# Patient Record
Sex: Female | Born: 1960 | Race: Black or African American | Hispanic: No | Marital: Married | Smoking: Never smoker
Health system: Southern US, Community
[De-identification: ages and names within clinical notes are randomized; demographics above are authoritative.]

## PROBLEM LIST (undated history)

## (undated) DIAGNOSIS — R011 Cardiac murmur, unspecified: Secondary | ICD-10-CM

## (undated) DIAGNOSIS — Z9889 Other specified postprocedural states: Secondary | ICD-10-CM

## (undated) DIAGNOSIS — R51 Headache: Secondary | ICD-10-CM

## (undated) DIAGNOSIS — T7840XA Allergy, unspecified, initial encounter: Secondary | ICD-10-CM

## (undated) DIAGNOSIS — E559 Vitamin D deficiency, unspecified: Secondary | ICD-10-CM

## (undated) DIAGNOSIS — D649 Anemia, unspecified: Secondary | ICD-10-CM

## (undated) DIAGNOSIS — R112 Nausea with vomiting, unspecified: Secondary | ICD-10-CM

## (undated) HISTORY — PX: OTHER SURGICAL HISTORY: SHX169

## (undated) HISTORY — DX: Vitamin D deficiency, unspecified: E55.9

## (undated) HISTORY — DX: Allergy, unspecified, initial encounter: T78.40XA

## (undated) HISTORY — PX: TUBAL LIGATION: SHX77

---

## 1998-04-01 ENCOUNTER — Emergency Department (HOSPITAL_COMMUNITY): Admission: EM | Admit: 1998-04-01 | Discharge: 1998-04-01 | Payer: Self-pay | Admitting: Emergency Medicine

## 1998-07-19 ENCOUNTER — Other Ambulatory Visit: Admission: RE | Admit: 1998-07-19 | Discharge: 1998-07-19 | Payer: Self-pay | Admitting: Obstetrics

## 1999-11-08 ENCOUNTER — Encounter: Admission: RE | Admit: 1999-11-08 | Discharge: 1999-11-08 | Payer: Self-pay | Admitting: Obstetrics

## 1999-11-08 ENCOUNTER — Encounter: Payer: Self-pay | Admitting: Obstetrics

## 2005-09-08 ENCOUNTER — Encounter: Admission: RE | Admit: 2005-09-08 | Discharge: 2005-09-08 | Payer: Self-pay | Admitting: Internal Medicine

## 2006-10-20 ENCOUNTER — Encounter: Admission: RE | Admit: 2006-10-20 | Discharge: 2006-10-20 | Payer: Self-pay | Admitting: Obstetrics

## 2006-10-28 ENCOUNTER — Encounter: Admission: RE | Admit: 2006-10-28 | Discharge: 2006-10-28 | Payer: Self-pay | Admitting: Obstetrics

## 2007-11-30 ENCOUNTER — Encounter: Admission: RE | Admit: 2007-11-30 | Discharge: 2007-11-30 | Payer: Self-pay | Admitting: Obstetrics

## 2009-05-15 ENCOUNTER — Encounter: Admission: RE | Admit: 2009-05-15 | Discharge: 2009-05-15 | Payer: Self-pay | Admitting: Obstetrics

## 2009-05-31 ENCOUNTER — Ambulatory Visit (HOSPITAL_COMMUNITY): Admission: RE | Admit: 2009-05-31 | Discharge: 2009-05-31 | Payer: Self-pay | Admitting: Obstetrics

## 2010-06-10 LAB — CBC
HCT: 38 % (ref 36.0–46.0)
Hemoglobin: 12.7 g/dL (ref 12.0–15.0)
MCHC: 33.3 g/dL (ref 30.0–36.0)
MCV: 93.3 fL (ref 78.0–100.0)
Platelets: 276 K/uL (ref 150–400)
RBC: 4.08 MIL/uL (ref 3.87–5.11)
RDW: 13.4 % (ref 11.5–15.5)
WBC: 5.3 K/uL (ref 4.0–10.5)

## 2010-06-10 LAB — PREGNANCY, URINE: Preg Test, Ur: NEGATIVE

## 2010-12-31 ENCOUNTER — Other Ambulatory Visit (HOSPITAL_COMMUNITY): Payer: Self-pay | Admitting: Obstetrics

## 2010-12-31 DIAGNOSIS — N920 Excessive and frequent menstruation with regular cycle: Secondary | ICD-10-CM

## 2010-12-31 DIAGNOSIS — Z1231 Encounter for screening mammogram for malignant neoplasm of breast: Secondary | ICD-10-CM

## 2011-01-17 ENCOUNTER — Ambulatory Visit (HOSPITAL_COMMUNITY)
Admission: RE | Admit: 2011-01-17 | Discharge: 2011-01-17 | Disposition: A | Discharge status: Home / Self Care | Payer: 59 | Source: Ambulatory Visit | Attending: Obstetrics | Admitting: Obstetrics

## 2011-01-17 DIAGNOSIS — D259 Leiomyoma of uterus, unspecified: Secondary | ICD-10-CM | POA: Insufficient documentation

## 2011-01-17 DIAGNOSIS — N920 Excessive and frequent menstruation with regular cycle: Secondary | ICD-10-CM

## 2011-01-17 DIAGNOSIS — N949 Unspecified condition associated with female genital organs and menstrual cycle: Secondary | ICD-10-CM | POA: Insufficient documentation

## 2011-01-17 DIAGNOSIS — Z1231 Encounter for screening mammogram for malignant neoplasm of breast: Secondary | ICD-10-CM

## 2011-01-21 ENCOUNTER — Other Ambulatory Visit: Payer: Self-pay | Admitting: Obstetrics

## 2011-01-21 DIAGNOSIS — R928 Other abnormal and inconclusive findings on diagnostic imaging of breast: Secondary | ICD-10-CM

## 2011-02-12 ENCOUNTER — Ambulatory Visit
Admission: RE | Admit: 2011-02-12 | Discharge: 2011-02-12 | Disposition: A | Discharge status: Home / Self Care | Payer: 59 | Source: Ambulatory Visit | Attending: Obstetrics | Admitting: Obstetrics

## 2011-02-12 DIAGNOSIS — R928 Other abnormal and inconclusive findings on diagnostic imaging of breast: Secondary | ICD-10-CM

## 2012-04-15 ENCOUNTER — Other Ambulatory Visit (HOSPITAL_COMMUNITY): Payer: Self-pay | Admitting: Obstetrics

## 2012-04-15 ENCOUNTER — Ambulatory Visit (HOSPITAL_COMMUNITY)
Admission: RE | Admit: 2012-04-15 | Discharge: 2012-04-15 | Disposition: A | Discharge status: Home / Self Care | Payer: 59 | Source: Ambulatory Visit | Attending: Obstetrics | Admitting: Obstetrics

## 2012-04-15 DIAGNOSIS — Z1231 Encounter for screening mammogram for malignant neoplasm of breast: Secondary | ICD-10-CM | POA: Insufficient documentation

## 2013-02-02 ENCOUNTER — Ambulatory Visit (INDEPENDENT_AMBULATORY_CARE_PROVIDER_SITE_OTHER): Payer: 59

## 2013-02-02 VITALS — BP 114/71 | HR 78 | Resp 16 | Ht 65.0 in | Wt 160.0 lb

## 2013-02-02 DIAGNOSIS — B351 Tinea unguium: Secondary | ICD-10-CM

## 2013-02-02 DIAGNOSIS — M79609 Pain in unspecified limb: Secondary | ICD-10-CM

## 2013-02-02 MED ORDER — KERYDIN 5 % EX SOLN: 1.0000 "application " | Freq: Once | CUTANEOUS | Status: DC

## 2013-02-02 NOTE — Patient Instructions (Signed)

## 2013-02-02 NOTE — Progress Notes (Signed)
  Subjective:    Patient ID: Monica Carson, female    DOB: Feb 20, 1961, 52 y.o.   MRN: [id number] "I know I have nail fungus."    HPI Comments: N  Hard, brittle, thick, discoloration L  Fungal Nails b/l D  Over 1 year O  Progressively  C  Gotten worse A   Shoes  T  None       Review of Systems  Skin:       Change in nails.  All other systems reviewed and are negative.       Objective:   Physical Exam Neurovascular status is intact with pedal pulses palpable. Epicritic and proprioceptive sensations intact and symmetric bilateral. Patient does have thick brittle friable incurvated nails hallux second third and fourth digits bilateral somewhat tender on palpation and attempted debridement this time. Nails are dark and discolored but presently For several years now patient is been covering with nail polish in the past. No previous treatment. Orthopedic biomechanical exam unremarkable noncontributory otherwise rectus foot type noted no open wounds lesions no secondary infection is to       Assessment & Plan:  Assessment this time is onychomycosis with keratoses incurvation of nails hallux second fourth and fifth digits bilateral. Nails are tender on palpation most significant deformity and arthrosis involving the hallux nails bilateral at this time discussed options for treatment including topicals oral medications laser treatment and excision options. Based on those discussions at this time is elected tried topical antifungal therapy utilizing Kerydin 5% topical antifungal solution patient will apply 1 drop to the affected nails daily for a 12 months duration as instructed prescription for it to her pharmacy and a coupon issued to the patient for prescription discount. Followup with in 6 months for reevaluation. Discontinue use and contact us if any irritation develops.  Alvan Dame DPM

## 2013-06-07 ENCOUNTER — Other Ambulatory Visit (HOSPITAL_COMMUNITY): Payer: Self-pay | Admitting: Obstetrics

## 2013-06-07 DIAGNOSIS — Z1231 Encounter for screening mammogram for malignant neoplasm of breast: Secondary | ICD-10-CM

## 2013-06-09 ENCOUNTER — Ambulatory Visit (HOSPITAL_COMMUNITY)
Admission: RE | Admit: 2013-06-09 | Discharge: 2013-06-09 | Disposition: A | Discharge status: Home / Self Care | Payer: 59 | Source: Ambulatory Visit | Attending: Obstetrics | Admitting: Obstetrics

## 2013-06-09 DIAGNOSIS — Z1231 Encounter for screening mammogram for malignant neoplasm of breast: Secondary | ICD-10-CM | POA: Insufficient documentation

## 2013-06-12 IMAGING — US US PELVIS COMPLETE
1 series · 13 of 25 positions shown · non-contrast
Comparison: None.

CLINICAL DATA: Intermittent pelvic pain.  Spotting.  LMP
01/04/2011.  Post ablation in May 2009

TRANSABDOMINAL AND TRANSVAGINAL ULTRASOUND OF PELVIS
TECHNIQUE: Both transabdominal and transvaginal ultrasound
examinations of the pelvis were performed. Transabdominal technique
was performed for global imaging of the pelvis including uterus,
ovaries, adnexal regions, and pelvic cul-de-sac.

[Series 1: us pelvis complete · 13 of 69 slices shown]
[im 1/69]
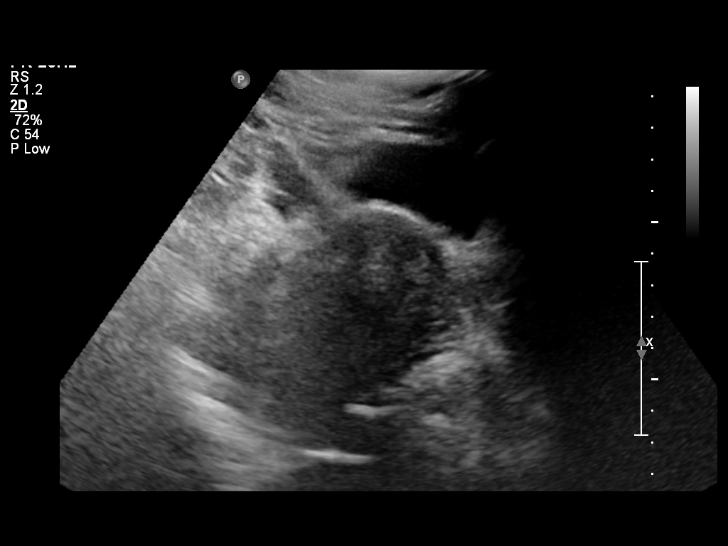
[im 6/69]
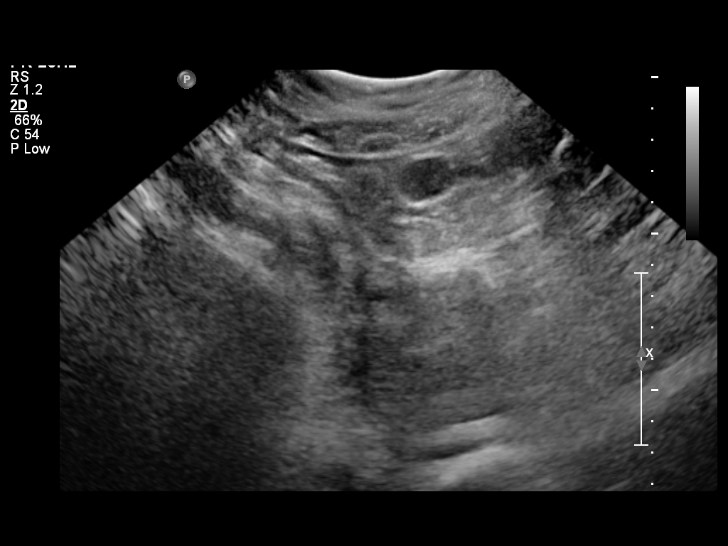
[im 12/69]
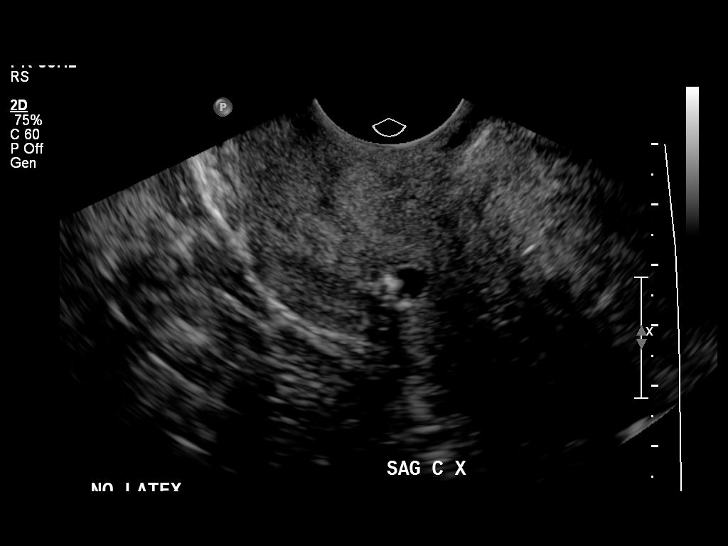
[im 18/69]
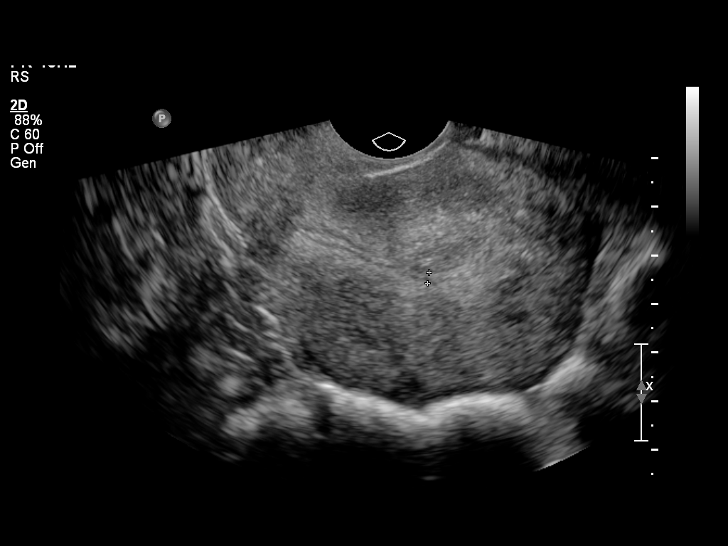
[im 23/69]
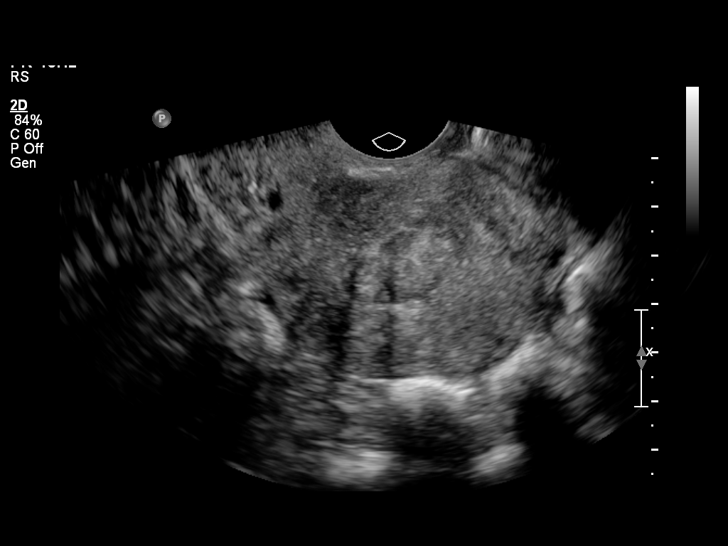
[im 29/69]
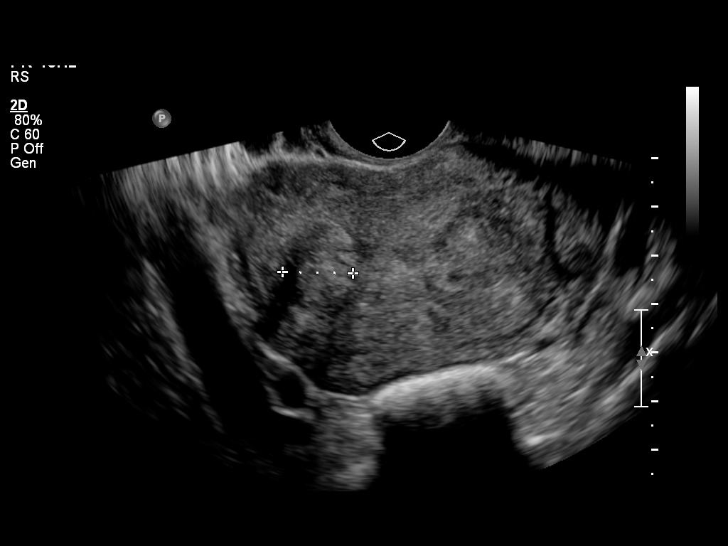
[im 35/69]
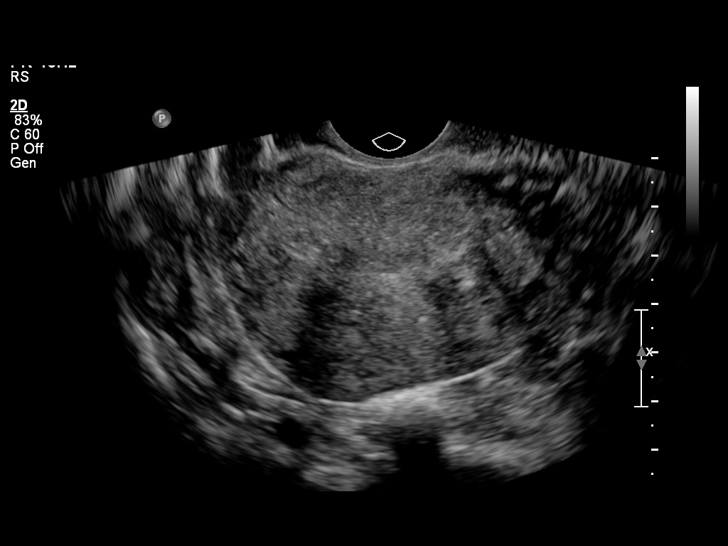
[im 40/69]
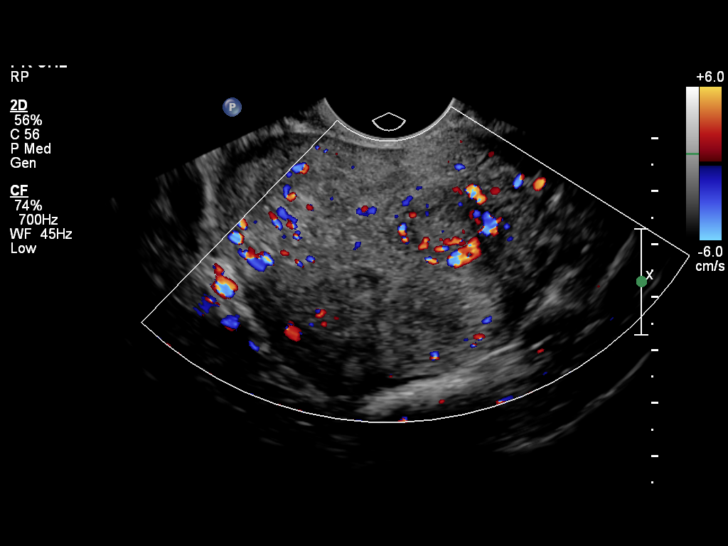
[im 46/69]
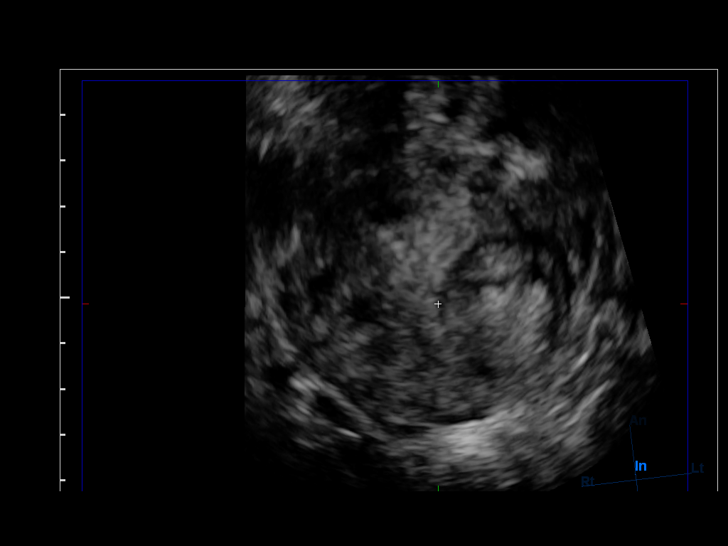
[im 52/69]
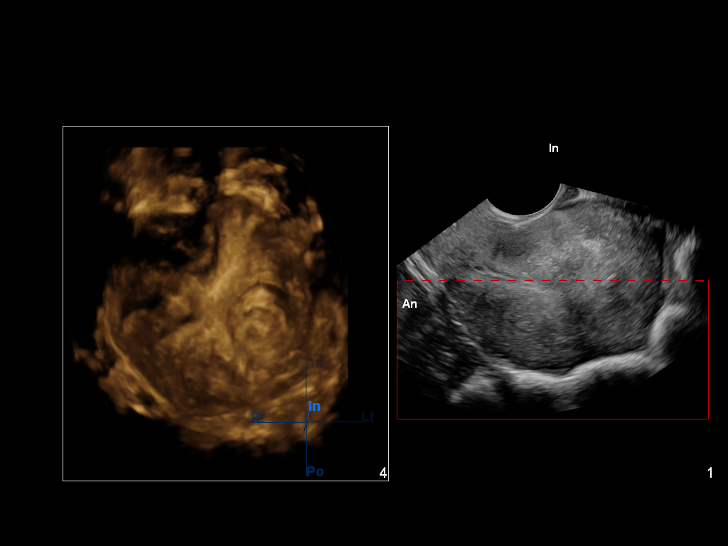
[im 57/69]
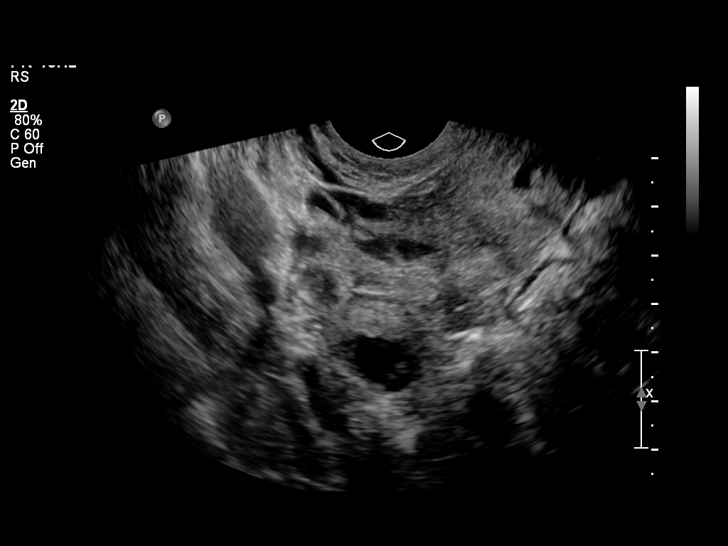
[im 63/69]
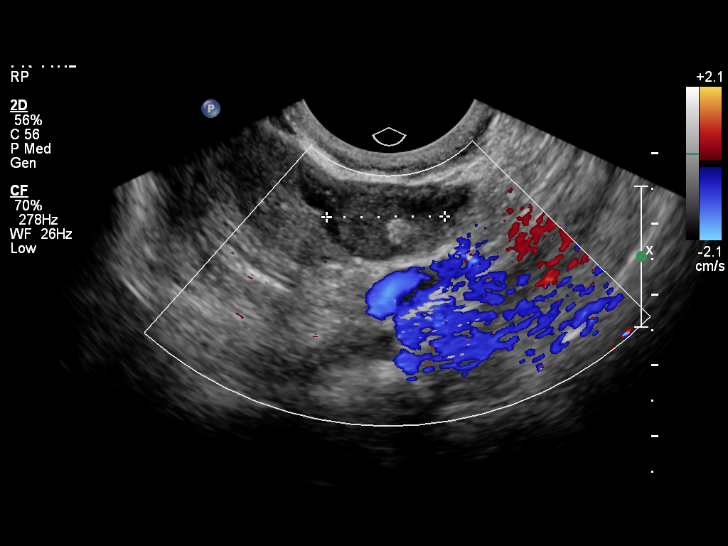
[im 69/69]
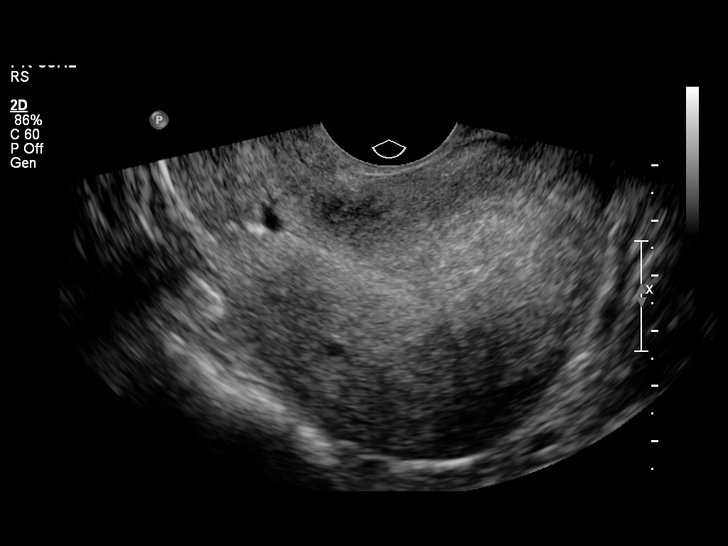

[13 of 25 positions shown; findings below may reference images not displayed]

It was necessary to proceed with endovaginal exam following the
transabdominal exam to visualize the myometrium, endometrium and
adnexa.
FINDINGS: Uterus: Is retroverted and retroflexed and demonstrates a sagittal
length of9.6 cm, AP depth of 5.1 cm in transverse width of 6.2 cm.
Focal fibroids are identified in the left lateral mid body
measuring 2.1 x 1.4 x 1.7 cm and in the right lateral mid body
measuring 1.4 x 1.4 x 1.4 cm.  These both appear mural with no
submucosal component suggested.

Endometrium: Appears thin with an AP width of 2.2 mm. Portions of
the endometrial lining are difficult to discern and the patchy
nature would correlate with the prior history of ablation. No areas
of focal thickening or heterogeneity are suggested and no loculated
intraendometrial fluid is seen.

Right ovary:  Measures 3.3 x 1.8 x 2.5 cm and has a normal
appearance

Left ovary: Measures 2.8 x 1.2 x 1.7 cm and contains a small
nonshadowing echogenic focus measuring 4.0 x 5.0 x 3.0 millimeters.
This could represent a small dermoid or hemorrhagic focus.

Other findings: No pelvic fluid or separate adnexal masses are
noted.
IMPRESSION: Fibroid uterus with fibroid sizes and locations as noted above.  No
focal endometrial abnormality is identified with endometrial
appearance correlating with history of prior ablation.

Normal right ovary.  Small echogenic focus within the left ovary
could represent a subcentimeter dermoid or acute hemorrhagic focus
within the left ovary.  This can be reassessed in 3 months to
assess for persistence.  A persistent echogenic focus would
correlate with a dermoid while resolution would correspond with a
small hemorrhagic focus.

## 2013-06-13 ENCOUNTER — Other Ambulatory Visit: Payer: Self-pay | Admitting: Obstetrics

## 2013-06-13 DIAGNOSIS — R928 Other abnormal and inconclusive findings on diagnostic imaging of breast: Secondary | ICD-10-CM

## 2013-06-22 ENCOUNTER — Ambulatory Visit
Admission: RE | Admit: 2013-06-22 | Discharge: 2013-06-22 | Disposition: A | Discharge status: Home / Self Care | Payer: 59 | Source: Ambulatory Visit | Attending: Obstetrics | Admitting: Obstetrics

## 2013-06-22 ENCOUNTER — Ambulatory Visit
Admission: RE | Admit: 2013-06-22 | Discharge: 2013-06-22 | Disposition: A | Discharge status: Home / Self Care | Payer: Self-pay | Source: Ambulatory Visit | Attending: Obstetrics | Admitting: Obstetrics

## 2013-06-22 DIAGNOSIS — R928 Other abnormal and inconclusive findings on diagnostic imaging of breast: Secondary | ICD-10-CM

## 2013-07-25 ENCOUNTER — Other Ambulatory Visit: Payer: Self-pay | Admitting: Gastroenterology

## 2013-08-02 ENCOUNTER — Ambulatory Visit (INDEPENDENT_AMBULATORY_CARE_PROVIDER_SITE_OTHER): Payer: 59

## 2013-08-02 VITALS — BP 142/88 | HR 70 | Resp 12

## 2013-08-02 DIAGNOSIS — M2040 Other hammer toe(s) (acquired), unspecified foot: Secondary | ICD-10-CM

## 2013-08-02 DIAGNOSIS — B351 Tinea unguium: Secondary | ICD-10-CM

## 2013-08-02 DIAGNOSIS — M79609 Pain in unspecified limb: Secondary | ICD-10-CM

## 2013-08-02 DIAGNOSIS — Q828 Other specified congenital malformations of skin: Secondary | ICD-10-CM

## 2013-08-02 NOTE — Patient Instructions (Signed)
Corns and Calluses A thickening of the skin layer (usually over bony areas, such as toe joints) is known as a corn. Two types of corns exist: hard corns and soft corns. Calluses are painless areas of skin thickening that are caused by repeated pressure or irritation. Corns tend to affect toe joints and the skin between the toes; whereas, a callus can appear on any part of the body (especially the hands, feet, or knees).  SYMPTOMS   Corn:  Presence of a small (1/8 to 3/8 inch [3 to 10 mm in diameter]), painful bump on the side or over the joint of a toe.  Hard corns are more common on the outer portion of the little (fifth) toe at the joint.  Soft corns are more common between bony bumps (prominences), usually between the fourth and fifth toes or between the second and third toes.  Callus:  A rough, thickened area of skin that appears after repeated pressure or irritation. CAUSES  The purpose of corns and calluses is to protect an area of skin from injury caused by repeated irritation (rubbing or squeezing). The presence of pressure causes the skin cells to grow at a faster rate than the cells of unaffected areas. This leads to an overgrowth (corn or callus). As apposed to hard corns, soft corns tend to develop between toes, because there is more moisture. Soft corns are often the result of prolonged shoe wear, which leads to increased perspiration and moisture.  RISK INCREASES WITH:  Shoes that are too tight.  Occupations or sports that involve repetitive pressure on the hands (racquetball and baseball) or sudden stops on hard surfaces (track and tennis).  Sports that require the athlete to wear shoes, perspire, or wear clothing or protective gear that causes the production of heat and friction. PREVENTION  Properly fitted shoes and equipment.  Modify activities to prevent constant pressure on specific areas of skin.  If possible, wear padding over areas of skin that are exposed to  repeated pressure or irritation.  Keep the area between the toes dry (with powder or by removing shoes often).  Relieve shoe pressure by stretching the areas of the shoe that cause the pressure and or use ointments to soften leather shoes. PROGNOSIS  Corns and calluses typically subside if the activity that causes them is eliminated. Recovery may take up to 3 weeks. Recurrence is likely even with treatment if the cause is not removed.  RELATED COMPLICATIONS  If one overcompensates in an attempt to avoid pain, he or she may experience pain in other areas due to the changes in body movements (mechanics). TREATMENT  The best way to treat corns and calluses is to remove the source of pressure. Corn and callus pads may be helpful in reducing pressure on the affected skin. For soft corns, try to keep the affected area dry. If you cannot find shoes that fit properly, a shoe repair shop may be able to alter your shoes to reduce pressure. Occasionally a cushion for the bottom of the foot (metatarsal bar) worn within the shoe may relieve pressure on corns or calluses of the foot. For calluses, you may be able to peel or rub the thickened area with a pumice stone, sandstone, callus file, or with sandpaper to remove the callus; wetting the affected area may make this process more effective. Do not cut the corn or callus with a razor or knife. If the corn or callus must be removed, then a medically trained person should perform   the procedure. After peeling away the upper layers of a corn once or twice a day, it may be recommended to apply a non-prescription 5% to 10% salicylic ointment and cover the area with a bandage. It very uncommon to have the bony bumps (at toe joints) surgically removed. MEDICATION   If pain medication is necessary, nonsteroidal anti-inflammatory medications, such as aspirin and ibuprofen, or other minor pain relievers, such as acetaminophen, are often recommended. Contact your caregiver  immediately if any bleeding, stomach upset, or signs of an allergic reaction occur.  Topical salicylic ointments (5% to 10%) may be of benefit.  Prescription pain medications may be given by a caregiver. Use only as directed and only as much as you need.  Soak the foot for 20 minutes, twice a day, in a gallon of warm water. This may help to soften corns and calluses. Care should be taken to thoroughly dry the foot, especially between the toes, after soaking. SEEK MEDICAL CARE IF:   Symptoms get worse or do not improve in 2 weeks despite treatment.  Any signs of infection develop, including redness, swelling, increased pain or tenderness, or increased warmth around the corn or callus.  New, unexplained symptoms develop (drugs used in treatment may produce side effects). Document Released: 03/03/2005 Document Revised: 05/26/2011 Document Reviewed: 06/15/2008 [employer] Patient Information 2014 [employer].  

## 2013-08-02 NOTE — Progress Notes (Signed)
   Subjective:    Patient ID: Monica Carson, female    DOB: 04/27/1960, 53 y.o.   MRN: [id number]  HPI  '' THE B/L TOENAILS ARE GETTING A LITTLE BETTER, BUT STILL HAVE DISCOLORATION.''  New problem:  PT STATED B/L 4TH TOES HAVE CORN AND BEEN HURTING FOR 6 MONTHS. THE TOES ARE GETTING WORSE AND SKIN ARE GETTING THICKER. THE TOES GET AGGRAVATED BY WEARING CERTAIN SHOES AND TRIED NO TREATMENT.  Review of Systems no new systemic changes or findings noted     Objective:   Physical Exam Neurovascular status is intact pedal pulses are palpable epicritic and proprioceptive sensations intact and symmetric normal plantar response DTRs not listed the nails are improving first second and fourth left first second third right show some distal discoloration and friability and brittleness and consistent with onychomycosis there is some proximal clearing noted on the first left and second left at this time advised to continue with topical antifungal keratin as instructed once daily to the affected nails for at least another 6 months patient also is having keratoses dorsolateral aspect for digits bilateral with traction Edison Pace from one particular shoe which she discontinued this time keratotic lesions are debrided and some tube foam padding dispensed       Assessment & Plan:  Assessment improving onychomycosis with topical antifungal therapy continue for additional 6 months as instructed  Patient does have hammertoe deformity with digital contractures and multiple 4 keratoses, HD 4 patient keratoses or debridement some tube foam padding dispensed for use maintain wide accommodative shoes reappointed on an as-needed basis for future followup dispensed literature about hammertoes for patient to consider if symptoms persist or worsen  Harriet Masson DPM

## 2013-09-21 ENCOUNTER — Encounter (HOSPITAL_COMMUNITY): Payer: Self-pay | Admitting: Pharmacy Technician

## 2013-09-28 ENCOUNTER — Encounter (HOSPITAL_COMMUNITY): Payer: Self-pay | Admitting: *Deleted

## 2013-10-04 ENCOUNTER — Other Ambulatory Visit (HOSPITAL_COMMUNITY): Payer: 59

## 2013-10-11 ENCOUNTER — Ambulatory Visit (HOSPITAL_COMMUNITY)
Admission: RE | Admit: 2013-10-11 | Discharge: 2013-10-11 | Disposition: A | Discharge status: Home / Self Care | Payer: 59 | Source: Ambulatory Visit | Attending: Gastroenterology | Admitting: Gastroenterology

## 2013-10-11 ENCOUNTER — Encounter (HOSPITAL_COMMUNITY)
Admission: RE | Disposition: A | Discharge status: Home / Self Care | Payer: 59 | Source: Ambulatory Visit | Attending: Gastroenterology

## 2013-10-11 ENCOUNTER — Encounter (HOSPITAL_COMMUNITY): Payer: 59 | Admitting: Anesthesiology

## 2013-10-11 ENCOUNTER — Ambulatory Visit (HOSPITAL_COMMUNITY): Payer: 59 | Admitting: Anesthesiology

## 2013-10-11 DIAGNOSIS — D649 Anemia, unspecified: Secondary | ICD-10-CM | POA: Insufficient documentation

## 2013-10-11 DIAGNOSIS — Z1211 Encounter for screening for malignant neoplasm of colon: Secondary | ICD-10-CM | POA: Diagnosis not present

## 2013-10-11 DIAGNOSIS — R51 Headache: Secondary | ICD-10-CM | POA: Insufficient documentation

## 2013-10-11 HISTORY — DX: Anemia, unspecified: D64.9

## 2013-10-11 HISTORY — DX: Nausea with vomiting, unspecified: Z98.890

## 2013-10-11 HISTORY — PX: COLONOSCOPY WITH PROPOFOL: SHX5780

## 2013-10-11 HISTORY — DX: Nausea with vomiting, unspecified: R11.2

## 2013-10-11 HISTORY — DX: Cardiac murmur, unspecified: R01.1

## 2013-10-11 HISTORY — DX: Headache: R51

## 2013-10-11 SURGERY — COLONOSCOPY WITH PROPOFOL
Anesthesia: Monitor Anesthesia Care

## 2013-10-11 MED ORDER — LIDOCAINE HCL (CARDIAC) 20 MG/ML IV SOLN
INTRAVENOUS | Status: AC
  Filled 2013-10-11: qty 5

## 2013-10-11 MED ORDER — PROPOFOL 10 MG/ML IV BOLUS
INTRAVENOUS | Status: AC
  Filled 2013-10-11: qty 20

## 2013-10-11 MED ORDER — PROPOFOL 10 MG/ML IV BOLUS
INTRAVENOUS | Status: DC | PRN
  Administered 2013-10-11 (×3): 20 mg via INTRAVENOUS

## 2013-10-11 MED ORDER — LIDOCAINE HCL (CARDIAC) 20 MG/ML IV SOLN
INTRAVENOUS | Status: DC | PRN
  Administered 2013-10-11: 100 mg via INTRAVENOUS

## 2013-10-11 MED ORDER — MIDAZOLAM HCL 2 MG/2ML IJ SOLN
INTRAMUSCULAR | Status: AC
  Filled 2013-10-11: qty 2

## 2013-10-11 MED ORDER — PROPOFOL INFUSION 10 MG/ML OPTIME
INTRAVENOUS | Status: DC | PRN
  Administered 2013-10-11: 120 ug/kg/min via INTRAVENOUS

## 2013-10-11 MED ORDER — LACTATED RINGERS IV SOLN
INTRAVENOUS | Status: DC | PRN
  Administered 2013-10-11: 10:00:00 via INTRAVENOUS

## 2013-10-11 MED ORDER — MIDAZOLAM HCL 5 MG/5ML IJ SOLN
INTRAMUSCULAR | Status: DC | PRN
  Administered 2013-10-11 (×2): 1 mg via INTRAVENOUS

## 2013-10-11 SURGICAL SUPPLY — 21 items

## 2013-10-11 NOTE — Anesthesia Preprocedure Evaluation (Addendum)
Anesthesia Evaluation  Patient identified by MRN, date of birth, ID band Patient awake    Reviewed: Allergy & Precautions, H&P , NPO status , Patient's Chart, lab work & pertinent test results  History of Anesthesia Complications (+) PONV and history of anesthetic complications  Airway Mallampati: II TM Distance: >3 FB Neck ROM: Full    Dental no notable dental hx.    Pulmonary neg pulmonary ROS,  breath sounds clear to auscultation  Pulmonary exam normal       Cardiovascular negative cardio ROS  + Valvular Problems/Murmurs Rhythm:Regular Rate:Normal     Neuro/Psych  Headaches, negative psych ROS   GI/Hepatic negative GI ROS, Neg liver ROS,   Endo/Other  negative endocrine ROS  Renal/GU negative Renal ROS     Musculoskeletal negative musculoskeletal ROS (+)   Abdominal   Peds  Hematology negative hematology ROS (+) anemia ,   Anesthesia Other Findings   Reproductive/Obstetrics negative OB ROS                           Anesthesia Physical Anesthesia Plan  ASA: I  Anesthesia Plan: MAC   Post-op Pain Management:    Induction: Intravenous  Airway Management Planned:   Additional Equipment:   Intra-op Plan:   Post-operative Plan:   Informed Consent: I have reviewed the patients History and Physical, chart, labs and discussed the procedure including the risks, benefits and alternatives for the proposed anesthesia with the patient or authorized representative who has indicated his/her understanding and acceptance.   Dental advisory given  Plan Discussed with: CRNA  Anesthesia Plan Comments:        Anesthesia Quick Evaluation

## 2013-10-11 NOTE — Transfer of Care (Signed)
Immediate Anesthesia Transfer of Care Note  Patient: Monica Carson  Procedure(s) Performed: Procedure(s): COLONOSCOPY WITH PROPOFOL (N/A)  Patient Location: PACU and Endoscopy Unit  Anesthesia Type:MAC  Level of Consciousness: awake, oriented, sedated and patient cooperative  Airway & Oxygen Therapy: Patient Spontanous Breathing and Patient connected to face mask oxygen  Post-op Assessment: Report given to PACU RN and Post -op Vital signs reviewed and stable  Post vital signs: Reviewed and stable  Complications: No apparent anesthesia complications

## 2013-10-11 NOTE — Anesthesia Postprocedure Evaluation (Signed)
Anesthesia Post Note  Patient: Monica Carson  Procedure(s) Performed: Procedure(s) (LRB): COLONOSCOPY WITH PROPOFOL (N/A)  Anesthesia type: MAC  Patient location: PACU  Post pain: Pain level controlled  Post assessment: Post-op Vital signs reviewed  Last Vitals: BP 98/61  Pulse 64  Temp(Src) 36.6 C (Oral)  Resp 12  SpO2 100%  LMP 09/22/2013  Post vital signs: Reviewed  Level of consciousness: awake  Complications: No apparent anesthesia complications

## 2013-10-11 NOTE — Op Note (Signed)
Procedure: Baseline screening colonoscopy  Endoscopist: Earle Gell  Premedication: Propofol administered by anesthesia  Procedure: The patient was placed in the left lateral decubitus position. Anal inspection and digital rectal exam were normal. The Pentax pediatric colonoscope was introduced into the rectum and advanced to the cecum. A normal-appearing appendiceal orifice and ileocecal valve were identified. Colonic preparation for the exam today was good.  Rectum. Normal  Sigmoid colon and descending colon. Normal  Splenic flexure. Normal  Transverse colon. Normal  Hepatic flexure. Normal  Ascending colon. Normal  Cecum and ileocecal valve. Normal  Assessment: Normal screening colonoscopy  Recommendation: Schedule repeat screening colonoscopy in 10 years

## 2013-10-11 NOTE — H&P (Signed)
  Procedure: Baseline screening colonoscopy  History: The patient is a 53 year old female born 1961/02/24. She denies a family history of colon cancer. She is scheduled to undergo a baseline screening colonoscopy today.  Past medical history: Migraine headache syndrome. Seasonal allergic rhinitis. Uterine fibroids. Cesarean section. Blood transfusion in 1991. D&C.  Medication allergies: None  Exam: The patient is alert and lying comfortably on the endoscopy stretcher. Lungs are clear to auscultation. Cardiac exam reveals a regular rhythm. Abdomen is soft and nontender to palpation  Plan: Proceed with baseline screening colonoscopy

## 2013-10-11 NOTE — Discharge Instructions (Signed)
Colonoscopy, Care After These instructions give you information on caring for yourself after your procedure. Your doctor may also give you more specific instructions. Call your doctor if you have any problems or questions after your procedure. HOME CARE  Do not drive for 24 hours.  Do not sign important papers or use machinery for 24 hours.  You may shower.  You may go back to your usual activities, but go slower for the first 24 hours.  Take rest breaks often during the first 24 hours.  Walk around or use warm packs on your belly (abdomen) if you have belly cramping or gas.  Drink enough fluids to keep your pee (urine) clear or pale yellow.  Resume your normal diet. Avoid heavy or fried foods.  Avoid drinking alcohol for 24 hours or as told by your doctor.  Only take medicines as told by your doctor. If a tissue sample (biopsy) was taken during the procedure:   Do not take aspirin or blood thinners for 7 days, or as told by your doctor.  Do not drink alcohol for 7 days, or as told by your doctor.  Eat soft foods for the first 24 hours. GET HELP IF: You still have a small amount of blood in your poop (stool) 2-3 days after the procedure. GET HELP RIGHT AWAY IF:  You have more than a small amount of blood in your poop.  You see clumps of tissue (blood clots) in your poop.  Your belly is puffy (swollen).  You feel sick to your stomach (nauseous) or throw up (vomit).  You have a fever.  You have belly pain that gets worse and medicine does not help. MAKE SURE YOU:  Understand these instructions.  Will watch your condition.  Will get help right away if you are not doing well or get worse. Document Released: 04/05/2010 Document Revised: 03/08/2013 Document Reviewed: 11/08/2012 [facility] Patient Information 2015 [place], [place]. This information is not intended to replace advice given to you by your health care provider. Make sure you discuss any questions you have with  your health care provider. Colonoscopy, Care After Refer to this sheet in the next few weeks. These instructions provide you with information on caring for yourself after your procedure. Your health care provider may also give you more specific instructions. Your treatment has been planned according to current medical practices, but problems sometimes occur. Call your health care provider if you have any problems or questions after your procedure. WHAT TO EXPECT AFTER THE PROCEDURE  After your procedure, it is typical to have the following:  A small amount of blood in your stool.  Moderate amounts of gas and mild abdominal cramping or bloating. HOME CARE INSTRUCTIONS  Do not drive, operate machinery, or sign important documents for 24 hours.  You may shower and resume your regular physical activities, but move at a slower pace for the first 24 hours.  Take frequent rest periods for the first 24 hours.  Walk around or put a warm pack on your abdomen to help reduce abdominal cramping and bloating.  Drink enough fluids to keep your urine clear or pale yellow.  You may resume your normal diet as instructed by your health care provider. Avoid heavy or fried foods that are hard to digest.  Avoid drinking alcohol for 24 hours or as instructed by your health care provider.  Only take over-the-counter or prescription medicines as directed by your health care provider.  If a tissue sample (biopsy) was taken  during your procedure:  Do not take aspirin or blood thinners for 7 days, or as instructed by your health care provider.  Do not drink alcohol for 7 days, or as instructed by your health care provider.  Eat soft foods for the first 24 hours. SEEK MEDICAL CARE IF: You have persistent spotting of blood in your stool 2-3 days after the procedure. SEEK IMMEDIATE MEDICAL CARE IF:  You have more than a small spotting of blood in your stool.  You pass large blood clots in your  stool.  Your abdomen is swollen (distended).  You have nausea or vomiting.  You have a fever.  You have increasing abdominal pain that is not relieved with medicine. Document Released: 10/16/2003 Document Revised: 12/22/2012 Document Reviewed: 11/08/2012 [facility] Patient Information 2015 [place], [place]. This information is not intended to replace advice given to you by your health care provider. Make sure you discuss any questions you have with your health care provider.

## 2013-10-12 ENCOUNTER — Encounter (HOSPITAL_COMMUNITY): Payer: Self-pay | Admitting: Gastroenterology

## 2014-01-31 ENCOUNTER — Ambulatory Visit (INDEPENDENT_AMBULATORY_CARE_PROVIDER_SITE_OTHER): Payer: 59

## 2014-01-31 VITALS — BP 118/77 | HR 90 | Resp 18

## 2014-01-31 DIAGNOSIS — L603 Nail dystrophy: Secondary | ICD-10-CM

## 2014-01-31 DIAGNOSIS — B351 Tinea unguium: Secondary | ICD-10-CM

## 2014-01-31 NOTE — Patient Instructions (Signed)
Onychomycosis/Fungal Toenails  WHAT IS IT? An infection that lies within the keratin of your nail plate that is caused by a fungus.  WHY ME? Fungal infections affect all ages, sexes, races, and creeds.  There may be many factors that predispose you to a fungal infection such as age, coexisting medical conditions such as diabetes, or an autoimmune disease; stress, medications, fatigue, genetics, etc.  Bottom line: fungus thrives in a warm, moist environment and your shoes offer such a location.  IS IT CONTAGIOUS? Theoretically, yes.  You do not want to share shoes, nail clippers or files with someone who has fungal toenails.  Walking around barefoot in the same room or sleeping in the same bed is unlikely to transfer the organism.  It is important to realize, however, that fungus can spread easily from one nail to the next on the same foot.  HOW DO WE TREAT THIS?  There are several ways to treat this condition.  Treatment may depend on many factors such as age, medications, pregnancy, liver and kidney conditions, etc.  It is best to ask your doctor which options are available to you.  1. No treatment.   Unlike many other medical concerns, you can live with this condition.  However for many people this can be a painful condition and may lead to ingrown toenails or a bacterial infection.  It is recommended that you keep the nails cut short to help reduce the amount of fungal nail. 2. Topical treatment.  These range from herbal remedies to prescription strength nail lacquers.  About 40-50% effective, topicals require twice daily application for approximately 9 to 12 months or until an entirely new nail has grown out.  The most effective topicals are medical grade medications available through physicians offices. 3. Oral antifungal medications.  With an 80-90% cure rate, the most common oral medication requires 3 to 4 months of therapy and stays in your system for a year as the new nail grows out.  Oral  antifungal medications do require blood work to make sure it is a safe drug for you.  A liver function panel will be performed prior to starting the medication and after the first month of treatment.  It is important to have the blood work performed to avoid any harmful side effects.  In general, this medication safe but blood work is required. 4. Laser Therapy.  This treatment is performed by applying a specialized laser to the affected nail plate.  This therapy is noninvasive, fast, and non-painful.  It is not covered by insurance and is therefore, out of pocket.  The results have been very good with a 80-95% cure rate.  [facility] is the only practice in the area to offer this therapy. 5. Permanent Nail Avulsion.  Removing the entire nail so that a new nail will not grow back.    Recommend applying nail antifungal once or twice a month as a prevention to keep fungus from recurring. Next  Also suggest utilizing a nail or cuticle cream to help with dry nailbeds

## 2014-01-31 NOTE — Progress Notes (Signed)
   Subjective:    Patient ID: Monica Carson, female    DOB: [birth year], 53 y.o.   MRN: [id number]  HPI I HAVE BEEN USING THE TOPICAL AND I CAN SEE SOME IMPROVEMENT ON ALL MY NAILS    Review of Systemsno new findings or systemic changes noted     Objective:   Physical Exam Neurovascular status is intact pedal pulses are palpable nails have greatly improved however there is some white drying or streaking in the nails of still some dark pigment in the left hallux nail distally is nearly grown out patient advised to continue using the keratin topical nail antifungal for possibly 2 or 3 more months and then once a month as a preventative thereafter. Also suggested a nail or cuticle cream for the dry nailbeds       Assessment & Plan:  Assessment resolving are improving onychomycosis of nails both hallux in particular discharge to an as-needed basis for future follow-up  Harriet Masson DPM

## 2014-03-23 ENCOUNTER — Other Ambulatory Visit: Payer: Self-pay

## 2014-03-27 ENCOUNTER — Other Ambulatory Visit: Payer: Self-pay | Admitting: *Deleted

## 2014-03-27 MED ORDER — TAVABOROLE 5 % EX SOLN: 1.0000 "application " | Freq: Every day | CUTANEOUS | Status: DC

## 2014-03-27 NOTE — Addendum Note (Signed)
Addended by: Elta Guadeloupe on: 03/27/2014 01:29 PM   Modules accepted: Orders

## 2014-03-28 NOTE — Progress Notes (Signed)
[facility] sent a prescription for [facility] to [facility] with 3 refills per Dr. Blenda Mounts.

## 2014-06-07 ENCOUNTER — Other Ambulatory Visit: Payer: Self-pay

## 2014-08-22 ENCOUNTER — Telehealth: Payer: Self-pay | Admitting: *Deleted

## 2014-08-22 DIAGNOSIS — B351 Tinea unguium: Secondary | ICD-10-CM

## 2014-08-22 DIAGNOSIS — L603 Nail dystrophy: Secondary | ICD-10-CM

## 2014-08-22 MED ORDER — TAVABOROLE 5 % EX SOLN: 1.0000 "application " | Freq: Every day | CUTANEOUS | Status: AC

## 2014-08-22 NOTE — Telephone Encounter (Signed)
[facility] form request new instructions for [facility] faxed.  Dr. Paulla Dolly states refill as instructed by Dr. Erasmo Downer 01/31/2014 orders in dictation.  Kerydin refilled with 6 additional refills to be applied to the affected areas monthly as a preventative.

## 2015-01-26 ENCOUNTER — Other Ambulatory Visit: Payer: Self-pay

## 2015-01-26 DIAGNOSIS — Z1231 Encounter for screening mammogram for malignant neoplasm of breast: Secondary | ICD-10-CM

## 2015-02-15 ENCOUNTER — Ambulatory Visit: Payer: Self-pay

## 2015-06-20 ENCOUNTER — Ambulatory Visit
Admission: RE | Admit: 2015-06-20 | Discharge: 2015-06-20 | Disposition: A | Discharge status: Home / Self Care | Payer: 59 | Source: Ambulatory Visit

## 2015-06-20 DIAGNOSIS — Z1231 Encounter for screening mammogram for malignant neoplasm of breast: Secondary | ICD-10-CM

## 2015-06-20 LAB — PROCEDURE REPORT - SCANNED: Pap: NEGATIVE

## 2015-11-16 IMAGING — MG MM DIAGNOSTIC UNILATERAL R
2 series · 2 of 2 positions shown · non-contrast
Comparison: With priors.

CLINICAL DATA: Abnormal right screening mammogram.

EXAM:
DIGITAL DIAGNOSTIC  RIGHT MAMMOGRAM WITH CAD
ULTRASOUND RIGHT BREAST

[R CC]
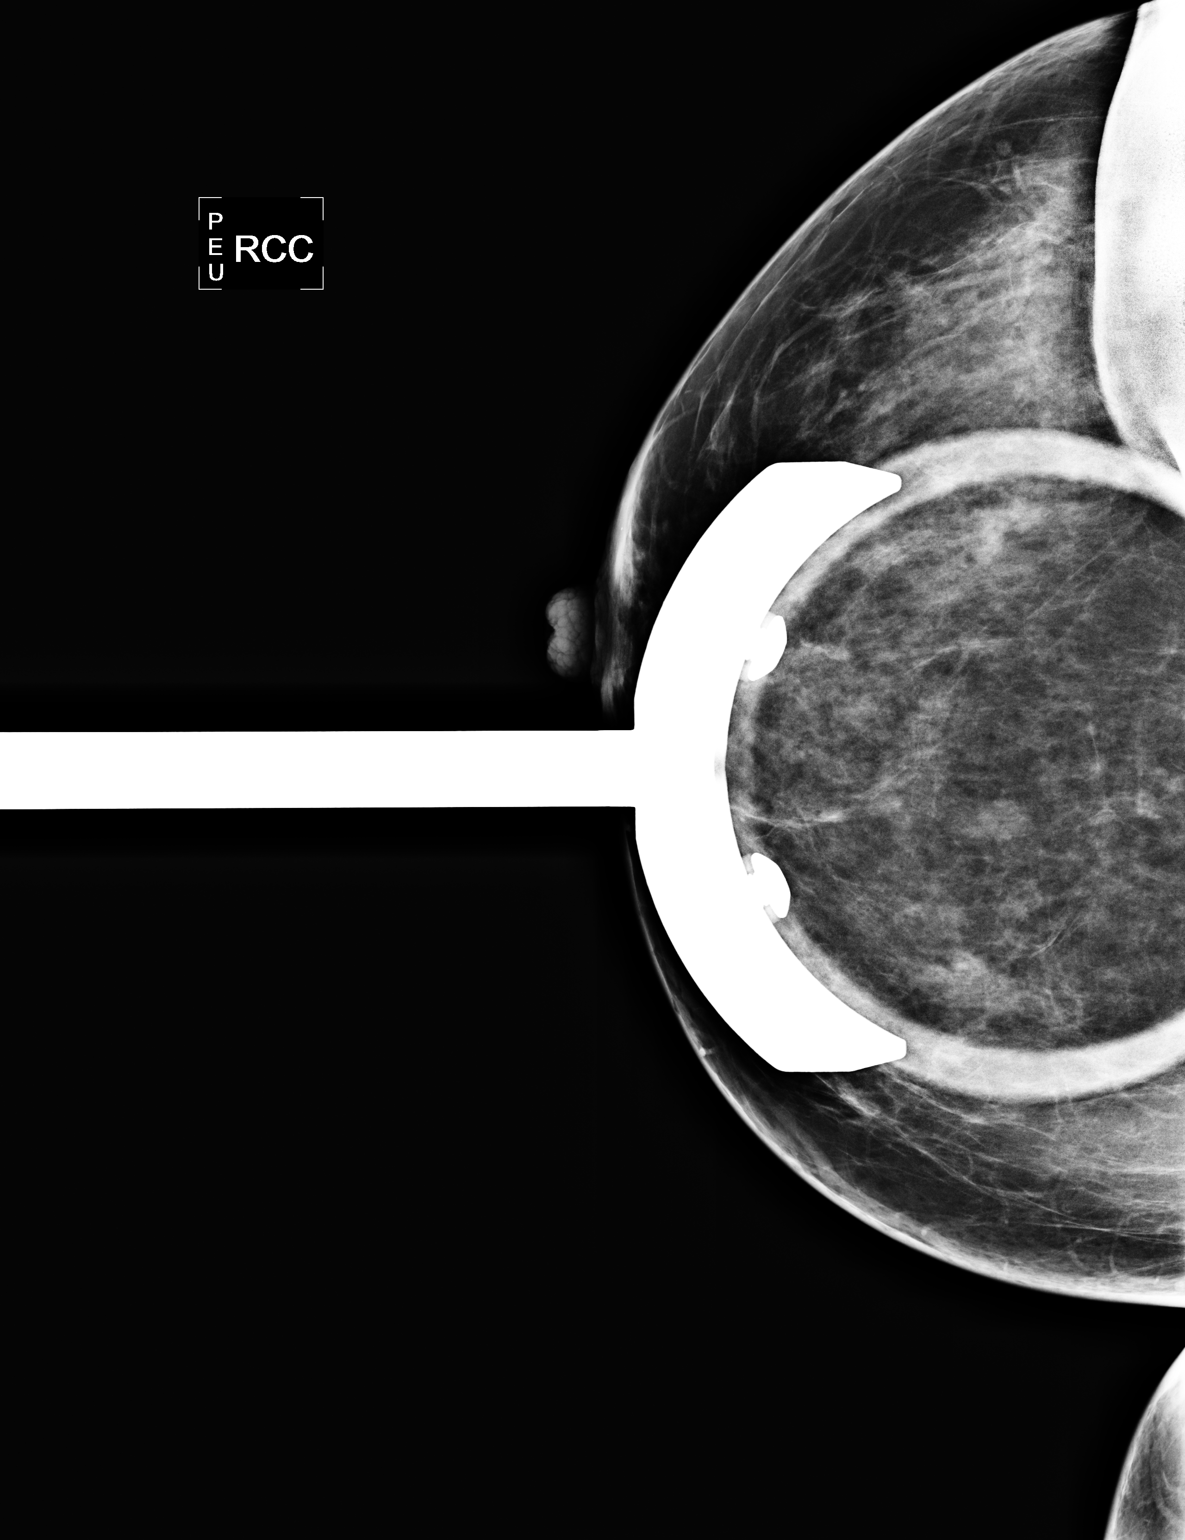

[R ML]
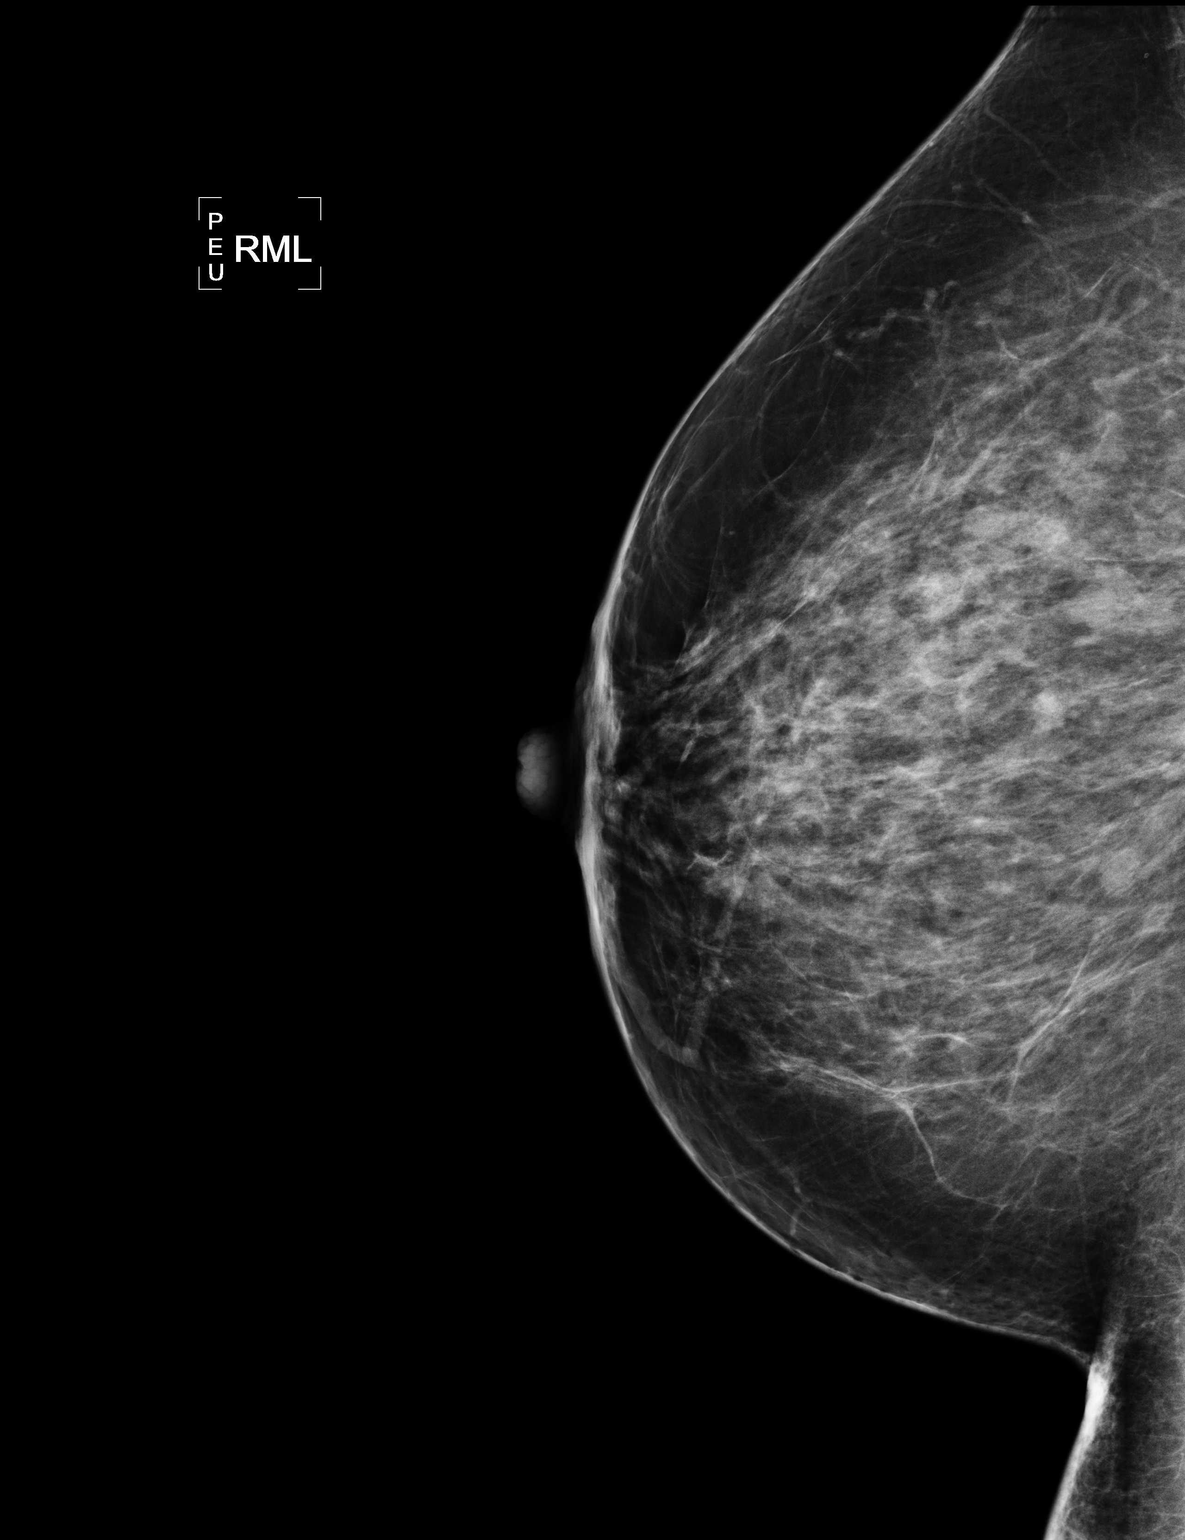

[2 of 2 positions shown; findings below may reference images not displayed]

ACR Breast Density Category c: The breast tissue is heterogeneously
dense, which may obscure small masses.
FINDINGS: True lateral and spot compression views of the right breast were
performed. There is persistence of a 6 mm low-density nodule in the
medial aspect of the breast. There are no associated malignant type
microcalcifications.

Mammographic images were processed with CAD.

On physical exam, I do not palpate a mass in the right breast.

Ultrasound is performed, showing a simple anechoic
well-circumscribed cyst in the right breast at 2 o'clock 3 cm from
the nipple measuring 5 x 4 x 5 mm. Sonographic evaluation of the
entire medial aspect of the right breast was performed. No
additional lesions visualized.
IMPRESSION: Right breast cyst.  No evidence of malignancy.

RECOMMENDATION:
Bilateral screening mammogram in 1 year is recommended.

I have discussed the findings and recommendations with the patient.
Results were also provided in writing at the conclusion of the
visit. If applicable, a reminder letter will be sent to the patient
regarding the next appointment.

BI-RADS CATEGORY  2: Benign finding(s).

## 2015-11-16 IMAGING — US US BREAST*R* LIMITED INC AXILLA
1 series · 4 of 4 positions shown · non-contrast
Comparison: With priors.

CLINICAL DATA: Abnormal right screening mammogram.

EXAM:
DIGITAL DIAGNOSTIC  RIGHT MAMMOGRAM WITH CAD
ULTRASOUND RIGHT BREAST

[Series 1: superficial breast · 4 of 4 slices shown]
[im 1/4]
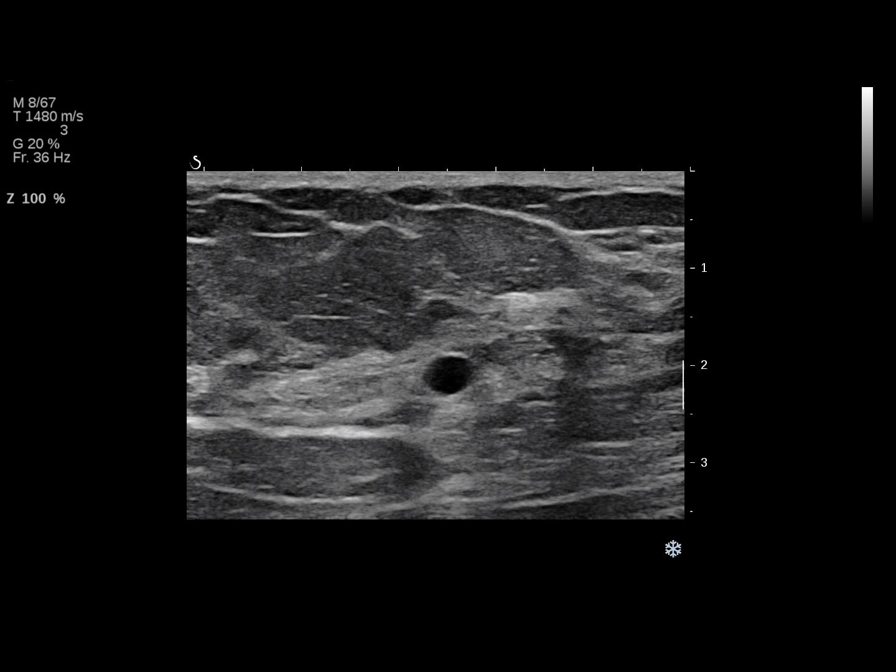
[im 2/4]
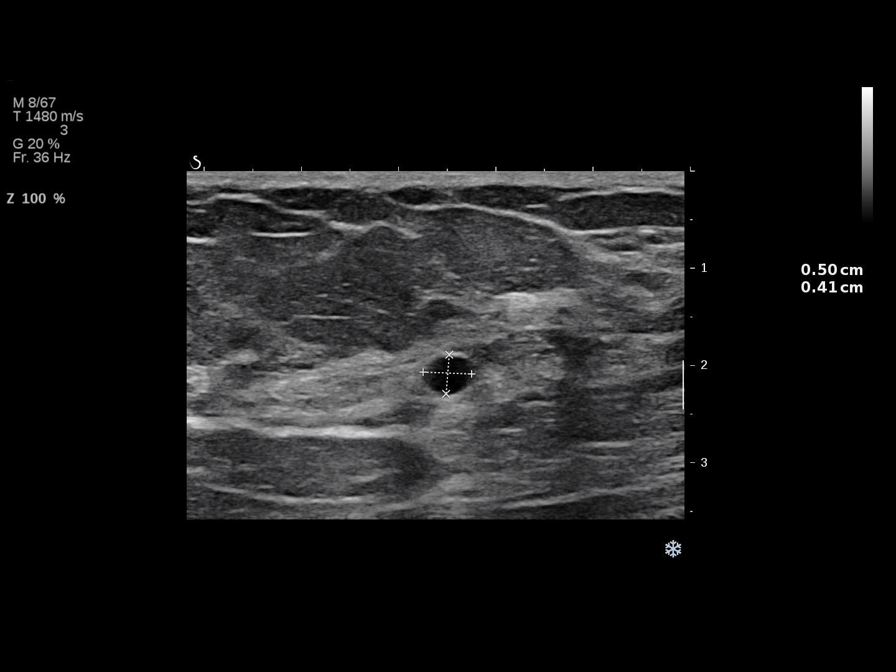
[im 3/4]
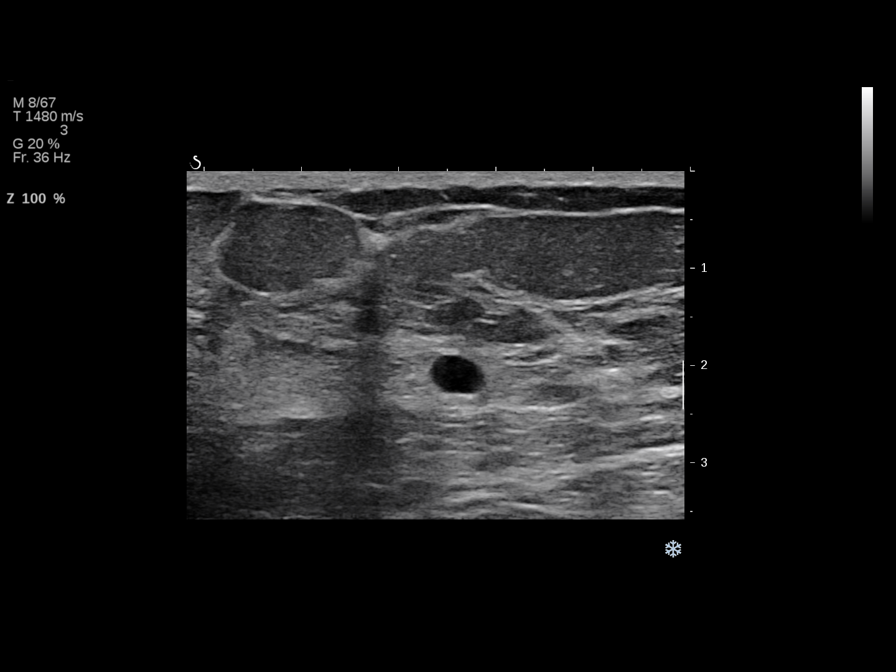
[im 4/4]
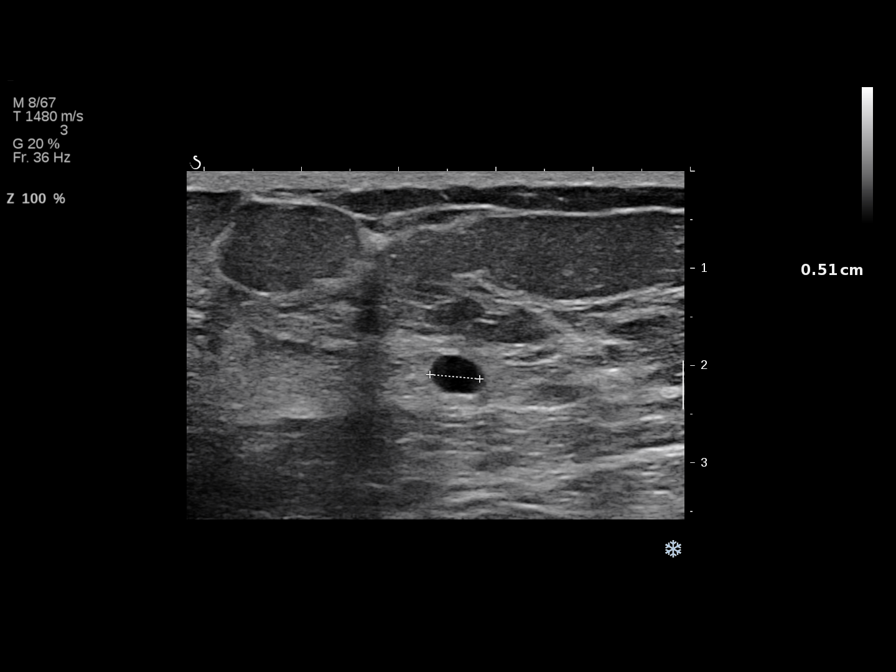

[4 of 4 positions shown; findings below may reference images not displayed]

ACR Breast Density Category c: The breast tissue is heterogeneously
dense, which may obscure small masses.
FINDINGS: True lateral and spot compression views of the right breast were
performed. There is persistence of a 6 mm low-density nodule in the
medial aspect of the breast. There are no associated malignant type
microcalcifications.

Mammographic images were processed with CAD.

On physical exam, I do not palpate a mass in the right breast.

Ultrasound is performed, showing a simple anechoic
well-circumscribed cyst in the right breast at 2 o'clock 3 cm from
the nipple measuring 5 x 4 x 5 mm. Sonographic evaluation of the
entire medial aspect of the right breast was performed. No
additional lesions visualized.
IMPRESSION: Right breast cyst.  No evidence of malignancy.

RECOMMENDATION:
Bilateral screening mammogram in 1 year is recommended.

I have discussed the findings and recommendations with the patient.
Results were also provided in writing at the conclusion of the
visit. If applicable, a reminder letter will be sent to the patient
regarding the next appointment.

BI-RADS CATEGORY  2: Benign finding(s).

## 2015-12-10 ENCOUNTER — Encounter: Payer: Self-pay | Admitting: *Deleted

## 2021-09-06 DIAGNOSIS — Z13220 Encounter for screening for lipoid disorders: Secondary | ICD-10-CM | POA: Diagnosis not present

## 2021-09-06 DIAGNOSIS — R7303 Prediabetes: Secondary | ICD-10-CM | POA: Diagnosis not present

## 2021-09-06 DIAGNOSIS — E559 Vitamin D deficiency, unspecified: Secondary | ICD-10-CM | POA: Diagnosis not present

## 2021-09-06 DIAGNOSIS — Z0000 Encounter for general adult medical examination without abnormal findings: Secondary | ICD-10-CM | POA: Diagnosis not present

## 2022-05-12 DIAGNOSIS — Z1231 Encounter for screening mammogram for malignant neoplasm of breast: Secondary | ICD-10-CM | POA: Diagnosis not present

## 2022-05-12 DIAGNOSIS — Z01419 Encounter for gynecological examination (general) (routine) without abnormal findings: Secondary | ICD-10-CM | POA: Diagnosis not present

## 2022-05-12 DIAGNOSIS — R319 Hematuria, unspecified: Secondary | ICD-10-CM | POA: Diagnosis not present

## 2022-06-12 DIAGNOSIS — Z713 Dietary counseling and surveillance: Secondary | ICD-10-CM | POA: Diagnosis not present

## 2022-06-12 DIAGNOSIS — Z6834 Body mass index (BMI) 34.0-34.9, adult: Secondary | ICD-10-CM | POA: Diagnosis not present

## 2022-07-24 DIAGNOSIS — Z6833 Body mass index (BMI) 33.0-33.9, adult: Secondary | ICD-10-CM | POA: Diagnosis not present

## 2022-07-24 DIAGNOSIS — Z713 Dietary counseling and surveillance: Secondary | ICD-10-CM | POA: Diagnosis not present

## 2022-09-08 DIAGNOSIS — Z13220 Encounter for screening for lipoid disorders: Secondary | ICD-10-CM | POA: Diagnosis not present

## 2022-09-08 DIAGNOSIS — Z0000 Encounter for general adult medical examination without abnormal findings: Secondary | ICD-10-CM | POA: Diagnosis not present

## 2022-09-08 DIAGNOSIS — E559 Vitamin D deficiency, unspecified: Secondary | ICD-10-CM | POA: Diagnosis not present

## 2022-09-08 DIAGNOSIS — R7303 Prediabetes: Secondary | ICD-10-CM | POA: Diagnosis not present

## 2022-09-15 DIAGNOSIS — Z13820 Encounter for screening for osteoporosis: Secondary | ICD-10-CM | POA: Diagnosis not present

## 2022-09-25 DIAGNOSIS — Z6833 Body mass index (BMI) 33.0-33.9, adult: Secondary | ICD-10-CM | POA: Diagnosis not present

## 2022-09-25 DIAGNOSIS — Z713 Dietary counseling and surveillance: Secondary | ICD-10-CM | POA: Diagnosis not present

## 2023-03-17 DIAGNOSIS — Z6833 Body mass index (BMI) 33.0-33.9, adult: Secondary | ICD-10-CM | POA: Diagnosis not present

## 2023-03-17 DIAGNOSIS — Z713 Dietary counseling and surveillance: Secondary | ICD-10-CM | POA: Diagnosis not present

## 2023-05-27 DIAGNOSIS — Z01419 Encounter for gynecological examination (general) (routine) without abnormal findings: Secondary | ICD-10-CM | POA: Diagnosis not present

## 2023-05-27 DIAGNOSIS — Z1231 Encounter for screening mammogram for malignant neoplasm of breast: Secondary | ICD-10-CM | POA: Diagnosis not present

## 2023-05-27 DIAGNOSIS — Z124 Encounter for screening for malignant neoplasm of cervix: Secondary | ICD-10-CM | POA: Diagnosis not present

## 2023-05-27 DIAGNOSIS — Z6832 Body mass index (BMI) 32.0-32.9, adult: Secondary | ICD-10-CM | POA: Diagnosis not present

## 2023-08-26 DIAGNOSIS — Z713 Dietary counseling and surveillance: Secondary | ICD-10-CM | POA: Diagnosis not present

## 2023-08-26 DIAGNOSIS — Z6833 Body mass index (BMI) 33.0-33.9, adult: Secondary | ICD-10-CM | POA: Diagnosis not present

## 2023-09-09 DIAGNOSIS — R7303 Prediabetes: Secondary | ICD-10-CM | POA: Diagnosis not present

## 2023-09-09 DIAGNOSIS — Z23 Encounter for immunization: Secondary | ICD-10-CM | POA: Diagnosis not present

## 2023-09-09 DIAGNOSIS — Z0000 Encounter for general adult medical examination without abnormal findings: Secondary | ICD-10-CM | POA: Diagnosis not present

## 2023-09-09 DIAGNOSIS — E559 Vitamin D deficiency, unspecified: Secondary | ICD-10-CM | POA: Diagnosis not present

## 2023-09-09 DIAGNOSIS — J309 Allergic rhinitis, unspecified: Secondary | ICD-10-CM | POA: Diagnosis not present
